# Patient Record
Sex: Female | Born: 1980 | Race: White | Hispanic: No | Marital: Married | Smoking: Never smoker
Health system: Southern US, Community
[De-identification: ages and names within clinical notes are randomized; demographics above are authoritative.]

## PROBLEM LIST (undated history)

## (undated) HISTORY — PX: APPENDECTOMY: SHX54

## (undated) HISTORY — PX: COLON SURGERY: SHX602

## (undated) HISTORY — PX: LIVER RESECTION: SHX1977

---

## 2013-06-11 ENCOUNTER — Emergency Department (HOSPITAL_BASED_OUTPATIENT_CLINIC_OR_DEPARTMENT_OTHER)
Admission: EM | Admit: 2013-06-11 | Discharge: 2013-06-11 | Disposition: A | Discharge status: Home / Self Care | Payer: Self-pay | Attending: Emergency Medicine | Admitting: Emergency Medicine

## 2013-06-11 ENCOUNTER — Encounter (HOSPITAL_BASED_OUTPATIENT_CLINIC_OR_DEPARTMENT_OTHER): Payer: Self-pay | Admitting: *Deleted

## 2013-06-11 ENCOUNTER — Emergency Department (HOSPITAL_BASED_OUTPATIENT_CLINIC_OR_DEPARTMENT_OTHER): Payer: Self-pay

## 2013-06-11 DIAGNOSIS — R109 Unspecified abdominal pain: Secondary | ICD-10-CM | POA: Insufficient documentation

## 2013-06-11 DIAGNOSIS — Z3202 Encounter for pregnancy test, result negative: Secondary | ICD-10-CM | POA: Insufficient documentation

## 2013-06-11 DIAGNOSIS — R112 Nausea with vomiting, unspecified: Secondary | ICD-10-CM | POA: Insufficient documentation

## 2013-06-11 LAB — CBC WITH DIFFERENTIAL/PLATELET
Basophils Absolute: 0 10*3/uL (ref 0.0–0.1)
HCT: 48.1 % — ABNORMAL HIGH (ref 36.0–46.0)
Lymphocytes Relative: 39 % (ref 12–46)
Neutro Abs: 3.4 10*3/uL (ref 1.7–7.7)
Platelets: 261 10*3/uL (ref 150–400)
RDW: 13.3 % (ref 11.5–15.5)
WBC: 6.8 10*3/uL (ref 4.0–10.5)

## 2013-06-11 LAB — COMPREHENSIVE METABOLIC PANEL WITH GFR
ALT: 16 U/L (ref 0–35)
AST: 19 U/L (ref 0–37)
Albumin: 3.9 g/dL (ref 3.5–5.2)
Alkaline Phosphatase: 88 U/L (ref 39–117)
BUN: 5 mg/dL — ABNORMAL LOW (ref 6–23)
CO2: 29 meq/L (ref 19–32)
Calcium: 9.9 mg/dL (ref 8.4–10.5)
Chloride: 102 meq/L (ref 96–112)
Creatinine, Ser: 0.7 mg/dL (ref 0.50–1.10)
GFR calc Af Amer: >90 mL/min
GFR calc non Af Amer: >90 mL/min
Glucose, Bld: 95 mg/dL (ref 70–99)
Potassium: 4.2 meq/L (ref 3.5–5.1)
Sodium: 141 meq/L (ref 135–145)
Total Bilirubin: <0.1 mg/dL — ABNORMAL LOW (ref 0.3–1.2)
Total Protein: 7.3 g/dL (ref 6.0–8.3)

## 2013-06-11 LAB — URINALYSIS, ROUTINE W REFLEX MICROSCOPIC
Bilirubin Urine: NEGATIVE
Hgb urine dipstick: NEGATIVE
Ketones, ur: NEGATIVE mg/dL
Specific Gravity, Urine: 1.007 (ref 1.005–1.030)
Urobilinogen, UA: 0.2 mg/dL (ref 0.0–1.0)

## 2013-06-11 LAB — LIPASE, BLOOD: Lipase: 26 U/L (ref 11–59)

## 2013-06-11 LAB — URINE MICROSCOPIC-ADD ON

## 2013-06-11 MED ORDER — MORPHINE SULFATE 4 MG/ML IJ SOLN
4.0000 mg | Freq: Once | INTRAMUSCULAR | Status: AC
  Administered 2013-06-11: 4 mg via INTRAVENOUS
  Filled 2013-06-11: qty 1

## 2013-06-11 MED ORDER — IOHEXOL 300 MG/ML  SOLN
100.0000 mL | Freq: Once | INTRAMUSCULAR | Status: AC | PRN
  Administered 2013-06-11: 100 mL via INTRAVENOUS

## 2013-06-11 MED ORDER — MORPHINE SULFATE 4 MG/ML IJ SOLN
INTRAMUSCULAR | Status: AC
  Administered 2013-06-11: 4 mg via INTRAVENOUS
  Filled 2013-06-11: qty 1

## 2013-06-11 MED ORDER — PROMETHAZINE HCL 25 MG PO TABS
25.0000 mg | ORAL_TABLET | Freq: Once | ORAL | Status: AC
  Administered 2013-06-11: 25 mg via ORAL
  Filled 2013-06-11: qty 1

## 2013-06-11 MED ORDER — IOHEXOL 300 MG/ML  SOLN
50.0000 mL | Freq: Once | INTRAMUSCULAR | Status: AC | PRN
  Administered 2013-06-11: 50 mL via ORAL

## 2013-06-11 MED ORDER — OXYCODONE-ACETAMINOPHEN 5-325 MG PO TABS: 1.0000 | ORAL_TABLET | ORAL | Status: DC | PRN

## 2013-06-11 MED ORDER — ONDANSETRON 8 MG PO TBDP: 8.0000 mg | ORAL_TABLET | Freq: Three times a day (TID) | ORAL | Status: DC | PRN

## 2013-06-11 MED ORDER — ONDANSETRON HCL 4 MG/2ML IJ SOLN
4.0000 mg | Freq: Once | INTRAMUSCULAR | Status: AC
  Administered 2013-06-11: 4 mg via INTRAVENOUS
  Filled 2013-06-11: qty 2

## 2013-06-11 MED ORDER — MORPHINE SULFATE 2 MG/ML IJ SOLN
INTRAMUSCULAR | Status: AC
  Administered 2013-06-11: 6 mg via INTRAVENOUS
  Filled 2013-06-11: qty 1

## 2013-06-11 MED ORDER — MORPHINE SULFATE 4 MG/ML IJ SOLN: 6.0000 mg | Freq: Once | INTRAMUSCULAR | Status: AC

## 2013-06-11 MED ORDER — HYDROMORPHONE HCL PF 1 MG/ML IJ SOLN
1.0000 mg | Freq: Once | INTRAMUSCULAR | Status: AC
  Administered 2013-06-11: 1 mg via INTRAVENOUS
  Filled 2013-06-11: qty 1

## 2013-06-11 NOTE — ED Notes (Signed)
Pt c/o diffuse abd pain , n/v/d x 2 days

## 2013-06-11 NOTE — ED Notes (Signed)
No adverse reactions to medication.

## 2013-06-11 NOTE — ED Notes (Signed)
Pt assisted to bathroom in NAD.  Upon returning to bed, Pt called out c/o increased abdominal pain which was worse after getting pain medications.  Discussed with Dr. Micheline Maze.  Will see.

## 2013-06-11 NOTE — ED Notes (Signed)
Pt reports having had morphine about 5 years ago without issues..pt also sts she had hives once with it.

## 2013-06-11 NOTE — ED Provider Notes (Signed)
CSN: [id number]     Arrival date & time 06/11/13  1337 History     First MD Initiated Contact with Patient 06/11/13 1346     Chief Complaint  Patient presents with  . Abdominal Pain    HPI Presents with 2-3 days of left-sided abdominal pain with associated nausea and vomiting.  No diarrhea.  No fevers or chills.  No flank pain.  No urinary complaints.  No vaginal complaints.  Her pain is mild to moderate in severity.  Nothing worsens or improves her pain.  The patient is a prior appendectomy as well as cholecystectomy.  She denies lower abdominal discomfort.  No chest pain or shortness of breath.  No cough or congestion.  History of PE.   History reviewed. No pertinent past medical history. Past Surgical History  Procedure Laterality Date  . Appendectomy    . Colon surgery    . Cesarean section    . Liver resection     History reviewed. No pertinent family history. History  Substance Use Topics  . Smoking status: Never Smoker   . Smokeless tobacco: Not on file  . Alcohol Use: No   OB History   Grav Para Term Preterm Abortions TAB SAB Ect Mult Living                 Review of Systems  All other systems reviewed and are negative.    Allergies  Morphine and related and Sulfa antibiotics  Home Medications   Current Outpatient Rx  Name  Route  Sig  Dispense  Refill  . ondansetron (ZOFRAN ODT) 8 MG disintegrating tablet   Oral   Take 1 tablet (8 mg total) by mouth every 8 (eight) hours as needed for nausea.   10 tablet   0   . oxyCODONE-acetaminophen (PERCOCET/ROXICET) 5-325 MG per tablet   Oral   Take 1 tablet by mouth every 4 (four) hours as needed for pain.   20 tablet   0    BP 117/79  Pulse 107  Temp(Src) 97.9 F (36.6 C) (Oral)  Resp 20  Ht 5\' 6"  (1.676 m)  Wt 185 lb (83.915 kg)  BMI 29.87 kg/m2  SpO2 100%  LMP 05/25/2013 Physical Exam  Nursing note and vitals reviewed. Constitutional: She is oriented to person, place, and time. She appears  well-developed and well-nourished. No distress.  HENT:  Head: Normocephalic and atraumatic.  Eyes: EOM are normal.  Neck: Normal range of motion.  Cardiovascular: Normal rate, regular rhythm and normal heart sounds.   Pulmonary/Chest: Effort normal and breath sounds normal.  Abdominal: Soft. She exhibits no distension.  Mild left-sided abdominal tenderness.  No guarding or rebound.  No rash.  No left flank tenderness.  Musculoskeletal: Normal range of motion.  Neurological: She is alert and oriented to person, place, and time.  Skin: Skin is warm and dry.  Psychiatric: She has a normal mood and affect. Judgment normal.    ED Course   Procedures (including critical care time)  Labs Reviewed  URINALYSIS, ROUTINE W REFLEX MICROSCOPIC - Abnormal; Notable for the following:    Leukocytes, UA SMALL (*)    All other components within normal limits  CBC WITH DIFFERENTIAL - Abnormal; Notable for the following:    RBC 5.20 (*)    Hemoglobin 15.9 (*)    HCT 48.1 (*)    All other components within normal limits  COMPREHENSIVE METABOLIC PANEL - Abnormal; Notable for the following:    BUN 5 (*)  Total Bilirubin <0.1 (*)    All other components within normal limits  PREGNANCY, URINE  URINE MICROSCOPIC-ADD ON  LIPASE, BLOOD   Ct Abdomen Pelvis W Contrast  06/11/2013   *RADIOLOGY REPORT*  Clinical Data: Diffuse abdominal pain.  Nausea vomiting for 2 days. Prior appendectomy, colon surgery and liver resection.  CT ABDOMEN AND PELVIS WITH CONTRAST  Technique:  Multidetector CT imaging of the abdomen and pelvis was performed following the standard protocol during bolus administration of intravenous contrast.  Contrast: 50mL OMNIPAQUE IOHEXOL 300 MG/ML  SOLN, OMNIPAQUE IOHEXOL 300 MG/ML  SOLN  Comparison: None.  Findings:  Under distended mid to distal transverse colon, descending colon and sigmoid colon limits evaluation for detection of colitis.  The small amount of free fluid in the pelvis  may be related to recent ovulation rather than colitis.  No definitive pericolonic inflammation identified.  Dilated proximal jejunum without point of obstruction identified. This may be related to the ingestion of contrast, enteritis although adhesion and partial obstruction not excluded.  No free intraperitoneal air.  Post cholecystectomy.  Within the liver anterior to the gallbladder fossa is a 3.8 x 3.6 x 3.5 cm liver lesion of indeterminate etiology.  Dedicated MRI of the liver recommended for further evaluation.  No focal worrisome splenic, pancreatic, renal or adrenal lesion.  No worrisome bony destructive lesion.  No abdominal aortic aneurysm.  No adenopathy.  Retroverted uterus.  No worrisome adnexal mass identified by CT.  Noncontrast filled imaging of the urinary bladder unremarkable.  Lung bases clear.  IMPRESSION: Under distended mid to distal transverse colon, descending colon and sigmoid colon limits evaluation for detection of colitis. No definitive pericolonic inflammation identified.  Dilated proximal jejunum without point of obstruction identified. This may be related to the ingestion of contrast or enteritis although adhesion and partial obstruction not excluded.  Within the liver anterior to the gallbladder fossa is a 3.8 x 3.6 x 3.5 cm liver lesion of indeterminate etiology.  Dedicated MRI of the liver recommended for further evaluation.   Original Report Authenticated By: Lacy Duverney, M.D.   1. Abdominal pain     MDM  CT scan pending.  Symptomatic treatment.  4:25 PM Patient is feeling better at this time.  I suspect this is more a gastroenteritis type presentation as she has had some loose stools he now reports.  She may have an early partial small bowel obstruction given her history abdominal surgery however my suspicion is low her.  She will be sent home with nausea medicine and pain medicine and instructed her on a clear liquid diet for the first 24 hours and then advance her  diet as tolerated.  She understands to return to the ER for new or worsening symptoms.  She does have benefits to the [facility] however she has not set up her care with the [facility] here in [place].  She does have a prior history of liver cancer for which she received directed chemotherapy in [place] at the [facility] better.  She has been informed of the abnormal finding in her liver and this is likely from her prior chemotherapy and radiation however she understands importance of close followup.  She states she will develop a relationship with the [facility] here in [place] and follow up with them regarding the potential need for an abdominal MRI as they will then have access to her prior oncology records.  She will return to the ER for new or worsening  symptoms.  Lyanne Co, MD 06/11/13 [phone]

## 2013-06-11 NOTE — ED Provider Notes (Signed)
5:28 PM Pt complaining of worsening pain after IV morphine.  Have rexamined.  +ttp epigastrium, LLQ w/o rebound or guarding.  Dose of IV dilaudid given and pt felt improved.  She was d/c'd home by Dr. Patria Mane' instructions.   1. Abdominal pain      Shanna Cisco, MD 06/12/13 0021

## 2013-06-22 ENCOUNTER — Ambulatory Visit: Payer: Self-pay | Attending: Internal Medicine | Admitting: Internal Medicine

## 2013-06-22 VITALS — BP 120/70 | HR 75 | Temp 98.4°F | Resp 16 | Ht 66.0 in | Wt 181.4 lb

## 2013-06-22 DIAGNOSIS — R160 Hepatomegaly, not elsewhere classified: Secondary | ICD-10-CM

## 2013-06-22 DIAGNOSIS — K769 Liver disease, unspecified: Secondary | ICD-10-CM

## 2013-06-22 MED ORDER — ZOLPIDEM TARTRATE 5 MG PO TABS: 5.0000 mg | ORAL_TABLET | Freq: Every evening | ORAL | Status: DC | PRN

## 2013-06-22 MED ORDER — TRAMADOL HCL 50 MG PO TABS: 50.0000 mg | ORAL_TABLET | Freq: Three times a day (TID) | ORAL | Status: DC | PRN

## 2013-06-22 NOTE — Progress Notes (Signed)
Pt. Is here to establish care. Pt states that she has a tumor on her liver, she also says that she has stomach pain. She has had some blood in her stool and inconstant stool. She recently was in the hospital for sever abdominal pain. D. Winfree C.M.A.

## 2013-06-22 NOTE — Patient Instructions (Signed)
Magnetic Resonance Imaging Magnetic resonance imaging (MRI) is a procedure that lets your doctor see detailed pictures of the inside of your body. MRI does not use X-rays. Instead, strong magnets and radio waves work together to form a sharp image. There is no X-ray radiation and the magnets and radio waves are harmless. Follow any instructions you were given to prepare for the test. LET YOUR CAREGIVER KNOW ABOUT: (Note: The magnet used in MRI can cause metal objects in your body to move.)  Any previous surgery.  If you have a pacemaker or any other implants.  You have metal splinters in your body.  You have tattoos.  You are pregnant or think you may be pregnant.  You are claustrophobic (afraid of confined spaces). BEFORE THE PROCEDURE  MRI uses strong magnets, so you will be asked to remove your watch, jewelry, and other metal objects. Some makeup also contains traces of metal, so you may have to remove that too. Braces and fillings normally are not a problem. An MRI test takes up to an hour. Please check in 60 minutes before your test is scheduled or as directed. PROCEDURE  You may be asked to wear a hospital gown.  The machine can be noisy. You may be given earplugs or headphones for music.  You may be injected with CONTRAST (a special dye that enhances the image).  MRI is done in a long magnetic chamber. You will lie down on a platform that slides into the magnetic chamber. Once inside, you will still be able to talk to the technologist.  You will be asked to hold very still. The technologist will tell you when you can shift position. You may have to wait a few minutes to make sure the images are readable. AFTER THE PROCEDURE  You can get back to normal activities right away. If you were given contrast, it will pass naturally through your body within a day. Your caregiver will let you know when the results are in or you may call back to check your results. Document Released:  10/08/2000 Document Revised: 01/03/2012 Document Reviewed: 10/11/2005 Exit[employer] Patient Information 2014 [employer].  

## 2013-06-22 NOTE — Progress Notes (Signed)
Patient ID: Erica Terry, female   DOB: 20-Dec-1980, 32 y.o.   MRN: [id number]  CC: for evaluation of liver mass  HPI: 32 year old female with no significant past medical history who presented to the clinic for evaluation of liver mass found on CT scan. Patient has no current complaints but he wanted to see what is the next step in management for evaluation. She was seen in the ER but did not want to be admitted due to financial reasons. Patient has no complaints of abdominal pain, nausea or vomiting. No complaints of weight loss.  Allergies  Allergen Reactions  . Morphine And Related   . Sulfa Antibiotics    No past medical history on file. Current Outpatient Prescriptions on File Prior to Visit  Medication Sig Dispense Refill  . ondansetron (ZOFRAN ODT) 8 MG disintegrating tablet Take 1 tablet (8 mg total) by mouth every 8 (eight) hours as needed for nausea.  10 tablet  0  . oxyCODONE-acetaminophen (PERCOCET/ROXICET) 5-325 MG per tablet Take 1 tablet by mouth every 4 (four) hours as needed for pain.  20 tablet  0   No current facility-administered medications on file prior to visit.   Family medical history significant for HTN, HLD,  History   Social History  . Marital Status: Married    Spouse Name: N/A    Number of Children: N/A  . Years of Education: N/A   Occupational History  . Not on file.   Social History Main Topics  . Smoking status: Never Smoker   . Smokeless tobacco: Not on file  . Alcohol Use: No  . Drug Use: No  . Sexual Activity: Yes    Birth Control/ Protection: None   Other Topics Concern  . Not on file   Social History Narrative  . No narrative on file    Review of Systems  Constitutional: Negative for fever, chills, diaphoresis, activity change, appetite change and fatigue.  HENT: Negative for ear pain, nosebleeds, congestion, facial swelling, rhinorrhea, neck pain, neck stiffness and ear discharge.   Eyes: Negative for pain, discharge, redness,  itching and visual disturbance.  Respiratory: Negative for cough, choking, chest tightness, shortness of breath, wheezing and stridor.   Cardiovascular: Negative for chest pain, palpitations and leg swelling.  Gastrointestinal: Negative for abdominal distention.  Genitourinary: Negative for dysuria, urgency, frequency, hematuria, flank pain, decreased urine volume, difficulty urinating and dyspareunia.  Musculoskeletal: Negative for back pain, joint swelling, arthralgias and gait problem.  Neurological: Negative for dizziness, tremors, seizures, syncope, facial asymmetry, speech difficulty, weakness, light-headedness, numbness and headaches.  Hematological: Negative for adenopathy. Does not bruise/bleed easily.  Psychiatric/Behavioral: Negative for hallucinations, behavioral problems, confusion, dysphoric mood, decreased concentration and agitation.    Objective:   Filed Vitals:   06/22/13 1432  BP: 120/70  Pulse: 75  Temp:   Resp:     Physical Exam  Constitutional: Appears well-developed and well-nourished. No distress.  HENT: Normocephalic. External right and left ear normal. Oropharynx is clear and moist.  Eyes: Conjunctivae and EOM are normal. PERRLA, no scleral icterus.  Neck: Normal ROM. Neck supple. No JVD. No tracheal deviation. No thyromegaly.  CVS: RRR, S1/S2 +, no murmurs, no gallops, no carotid bruit.  Pulmonary: Effort and breath sounds normal, no stridor, rhonchi, wheezes, rales.  Abdominal: Soft. BS +,  no distension, tenderness, rebound or guarding.  Musculoskeletal: Normal range of motion. No edema and no tenderness.  Lymphadenopathy: No lymphadenopathy noted, cervical, inguinal. Neuro: Alert. Normal reflexes, muscle tone coordination. No cranial  nerve deficit. Skin: Skin is warm and dry. No rash noted. Not diaphoretic. No erythema. No pallor.  Psychiatric: Normal mood and affect. Behavior, judgment, thought content normal.   Lab Results  Component Value Date    WBC 6.8 06/11/2013   HGB 15.9* 06/11/2013   HCT 48.1* 06/11/2013   MCV 92.5 06/11/2013   PLT 261 06/11/2013   Lab Results  Component Value Date   CREATININE 0.70 06/11/2013   BUN 5* 06/11/2013   NA 141 06/11/2013   K 4.2 06/11/2013   CL 102 06/11/2013   CO2 29 06/11/2013    No results found for this basename: HGBA1C   Lipid Panel  No results found for this basename: chol, trig, hdl, cholhdl, vldl, ldlcalc       Assessment and plan:   Patient Active Problem List   Diagnosis Date Noted  . Liver mass 06/22/2013    Priority: High - referral provided to GI for further evaluation - ultram PRN for pain relief as needed

## 2013-06-26 ENCOUNTER — Other Ambulatory Visit: Payer: Self-pay | Admitting: Internal Medicine

## 2013-06-26 DIAGNOSIS — R160 Hepatomegaly, not elsewhere classified: Secondary | ICD-10-CM

## 2013-06-28 ENCOUNTER — Ambulatory Visit: Payer: Self-pay

## 2013-06-29 ENCOUNTER — Ambulatory Visit: Payer: Self-pay | Attending: Internal Medicine

## 2013-06-29 ENCOUNTER — Ambulatory Visit (HOSPITAL_COMMUNITY)
Admission: RE | Admit: 2013-06-29 | Discharge: 2013-06-29 | Disposition: A | Discharge status: Home / Self Care | Payer: Self-pay | Source: Ambulatory Visit | Attending: Internal Medicine | Admitting: Internal Medicine

## 2013-06-29 DIAGNOSIS — R160 Hepatomegaly, not elsewhere classified: Secondary | ICD-10-CM

## 2013-06-29 DIAGNOSIS — K7689 Other specified diseases of liver: Secondary | ICD-10-CM | POA: Insufficient documentation

## 2013-06-29 DIAGNOSIS — Z9089 Acquired absence of other organs: Secondary | ICD-10-CM | POA: Insufficient documentation

## 2013-06-29 MED ORDER — GADOBENATE DIMEGLUMINE 529 MG/ML IV SOLN
20.0000 mL | Freq: Once | INTRAVENOUS | Status: AC
  Administered 2013-06-29: 17 mL via INTRAVENOUS

## 2013-07-03 ENCOUNTER — Encounter: Payer: Self-pay | Admitting: Internal Medicine

## 2013-07-03 ENCOUNTER — Ambulatory Visit: Payer: Self-pay | Attending: Internal Medicine | Admitting: Internal Medicine

## 2013-07-03 VITALS — BP 123/84 | HR 94 | Temp 98.1°F | Ht 66.0 in | Wt 180.0 lb

## 2013-07-03 DIAGNOSIS — R112 Nausea with vomiting, unspecified: Secondary | ICD-10-CM | POA: Insufficient documentation

## 2013-07-03 DIAGNOSIS — R197 Diarrhea, unspecified: Secondary | ICD-10-CM | POA: Insufficient documentation

## 2013-07-03 DIAGNOSIS — R109 Unspecified abdominal pain: Secondary | ICD-10-CM | POA: Insufficient documentation

## 2013-07-03 DIAGNOSIS — K769 Liver disease, unspecified: Secondary | ICD-10-CM

## 2013-07-03 DIAGNOSIS — R160 Hepatomegaly, not elsewhere classified: Secondary | ICD-10-CM

## 2013-07-03 MED ORDER — ONDANSETRON 4 MG PO TBDP: 4.0000 mg | ORAL_TABLET | Freq: Three times a day (TID) | ORAL | Status: AC | PRN

## 2013-07-03 NOTE — Progress Notes (Signed)
Patient ID: Erica Terry, female   DOB: 05/25/1981, 32 y.o.   MRN: [id number] Patient Demographics  Erica Terry, is a 32 y.o. female  WUJ:[id number]  [id number]  DOB - [birth year]  Chief Complaint  Patient presents with  . Shirlee Latch follow up        Subjective:   Erica Terry today is here for a follow up visit.  Patient is a 32 year old female presented for followup visit. Patient complains of having chronic nausea, vomiting, diarrhea, abdominal pain for the last 3 weeks., Has been able to tolerate liquid diet. Patient was seen in the ED in 8/14 for the same symptoms.  Objective:    Filed Vitals:   07/03/13 1704  BP: 123/84  Pulse: 94  Temp: 98.1 F (36.7 C)  TempSrc: Oral  Height: 5\' 6"  (1.676 m)  Weight: 180 lb (81.647 kg)  SpO2: 99%     ALLERGIES:   Allergies  Allergen Reactions  . Morphine And Related   . Sulfa Antibiotics     PAST MEDICAL HISTORY: No past medical history on file.  MEDICATIONS AT HOME: Prior to Admission medications   Not on File     Exam  General appearance :Awake, alert, NAD, Speech Clear.  HEENT: Atraumatic and Normocephalic, PERLA Neck: supple, no JVD. No cervical lymphadenopathy.  Chest: Clear to auscultation bilaterally, no wheezing, rales or rhonchi CVS: S1 S2 regular, no murmurs.  Abdomen: soft, NBS, tender in left lower quadrant and epigastric region  ND, no gaurding, rigidity or rebound. Extremities: no cyanosis or clubbing, B/L Lower Ext shows no edema Neurology: Awake alert, and oriented X 3, CN II-XII intact, Non focal Skin: No Rash or lesions Wounds:N/A    Data Review   Basic Metabolic Panel: No results found for this basename: NA, K, CL, CO2, GLUCOSE, BUN, CREATININE, CALCIUM, MG, PHOS,  in the last 168 hours Liver Function Tests: No results found for this basename: AST, ALT, ALKPHOS, BILITOT, PROT, ALBUMIN,  in the last 168 hours  CBC: No results found for this basename: WBC, NEUTROABS, HGB,  HCT, MCV, PLT,  in the last 168 hours  ------------------------------------------------------------------------------------------------------------------ No results found for this basename: HGBA1C,  in the last 72 hours ------------------------------------------------------------------------------------------------------------------ No results found for this basename: CHOL, HDL, LDLCALC, TRIG, CHOLHDL, LDLDIRECT,  in the last 72 hours ------------------------------------------------------------------------------------------------------------------ No results found for this basename: TSH, T4TOTAL, FREET3, T3FREE, THYROIDAB,  in the last 72 hours ------------------------------------------------------------------------------------------------------------------ No results found for this basename: VITAMINB12, FOLATE, FERRITIN, TIBC, IRON, RETICCTPCT,  in the last 72 hours  Coagulation profile  No results found for this basename: INR, PROTIME,  in the last 168 hours    Assessment & Plan   Active Problems: Nausea, vomiting, abdominal pain, diarrhea x 3 weeks: ? Colitis, denies any antibiotic use in last 3 months - I explained to the patient that she can go to the ER for urgent evaluation with a CT imaging and GI evaluation. Patient lives in [place] and is 45 minutes away from here. She states that she is able to tolerate liquid diet and can do outpatient CT imaging and urgent GI evaluation. - Urgent CT with contrast, ambulatory referral for urgent GI evaluation - Recommended patient to continue Zofran, liquid diet, tramadol   Follow-up in 1 week     Azalynn Maxim M.D. 07/03/2013, 5:50 PM

## 2013-07-10 ENCOUNTER — Telehealth: Payer: Self-pay

## 2013-07-10 NOTE — Telephone Encounter (Signed)
Appointment for [place] scheduled for 9/19 10 30  am Patient is aware of her appt and that she needs to pick up the contrast Prior to the appt

## 2013-07-13 ENCOUNTER — Ambulatory Visit (HOSPITAL_COMMUNITY): Payer: Self-pay

## 2013-07-18 ENCOUNTER — Ambulatory Visit: Payer: Self-pay

## 2013-07-23 ENCOUNTER — Ambulatory Visit: Payer: Self-pay

## 2013-07-27 ENCOUNTER — Ambulatory Visit: Payer: Self-pay

## 2013-08-02 ENCOUNTER — Encounter: Payer: Self-pay | Admitting: Internal Medicine

## 2017-08-16 ENCOUNTER — Emergency Department (HOSPITAL_COMMUNITY)
Admission: EM | Admit: 2017-08-16 | Discharge: 2017-08-16 | Disposition: A | Discharge status: Home / Self Care | Payer: Self-pay | Attending: Emergency Medicine | Admitting: Emergency Medicine

## 2017-08-16 ENCOUNTER — Emergency Department (HOSPITAL_COMMUNITY): Payer: Self-pay

## 2017-08-16 ENCOUNTER — Encounter (HOSPITAL_COMMUNITY): Payer: Self-pay

## 2017-08-16 DIAGNOSIS — Y929 Unspecified place or not applicable: Secondary | ICD-10-CM | POA: Insufficient documentation

## 2017-08-16 DIAGNOSIS — Y939 Activity, unspecified: Secondary | ICD-10-CM | POA: Insufficient documentation

## 2017-08-16 DIAGNOSIS — Z79899 Other long term (current) drug therapy: Secondary | ICD-10-CM | POA: Insufficient documentation

## 2017-08-16 DIAGNOSIS — S0081XA Abrasion of other part of head, initial encounter: Secondary | ICD-10-CM | POA: Insufficient documentation

## 2017-08-16 DIAGNOSIS — F0781 Postconcussional syndrome: Secondary | ICD-10-CM | POA: Insufficient documentation

## 2017-08-16 DIAGNOSIS — Y999 Unspecified external cause status: Secondary | ICD-10-CM | POA: Insufficient documentation

## 2017-08-16 DIAGNOSIS — R1011 Right upper quadrant pain: Secondary | ICD-10-CM | POA: Insufficient documentation

## 2017-08-16 DIAGNOSIS — R55 Syncope and collapse: Secondary | ICD-10-CM | POA: Insufficient documentation

## 2017-08-16 LAB — COMPREHENSIVE METABOLIC PANEL
ALBUMIN: 3.7 g/dL (ref 3.5–5.0)
ALT: 18 U/L (ref 14–54)
ANION GAP: 9 (ref 5–15)
AST: 16 U/L (ref 15–41)
Alkaline Phosphatase: 96 U/L (ref 38–126)
BUN: 11 mg/dL (ref 6–20)
CHLORIDE: 104 mmol/L (ref 101–111)
CO2: 26 mmol/L (ref 22–32)
Calcium: 9.5 mg/dL (ref 8.9–10.3)
Creatinine, Ser: 0.65 mg/dL (ref 0.44–1.00)
GFR calc Af Amer: >60 mL/min (ref >60)
GFR calc non Af Amer: >60 mL/min (ref >60)
GLUCOSE: 120 mg/dL — AB (ref 65–99)
POTASSIUM: 3.8 mmol/L (ref 3.5–5.1)
SODIUM: 139 mmol/L (ref 135–145)
Total Bilirubin: 0.4 mg/dL (ref 0.3–1.2)
Total Protein: 7.1 g/dL (ref 6.5–8.1)

## 2017-08-16 LAB — CBC
HCT: 44.4 % (ref 36.0–46.0)
Hemoglobin: 15 g/dL (ref 12.0–15.0)
MCH: 30.5 pg (ref 26.0–34.0)
MCHC: 33.8 g/dL (ref 30.0–36.0)
MCV: 90.2 fL (ref 78.0–100.0)
Platelets: 295 10*3/uL (ref 150–400)
RBC: 4.92 MIL/uL (ref 3.87–5.11)
RDW: 13.5 % (ref 11.5–15.5)
WBC: 14.1 10*3/uL — ABNORMAL HIGH (ref 4.0–10.5)

## 2017-08-16 LAB — URINALYSIS, ROUTINE W REFLEX MICROSCOPIC
Bilirubin Urine: NEGATIVE
Glucose, UA: NEGATIVE mg/dL
HGB URINE DIPSTICK: NEGATIVE
Ketones, ur: NEGATIVE mg/dL
Leukocytes, UA: NEGATIVE
NITRITE: NEGATIVE
PROTEIN: NEGATIVE mg/dL
Specific Gravity, Urine: 1.011 (ref 1.005–1.030)
pH: 6 (ref 5.0–8.0)

## 2017-08-16 LAB — POC URINE PREG, ED: PREG TEST UR: NEGATIVE

## 2017-08-16 LAB — I-STAT TROPONIN, ED: Troponin i, poc: 0 ng/mL (ref 0.00–0.08)

## 2017-08-16 LAB — LIPASE, BLOOD: Lipase: 24 U/L (ref 11–51)

## 2017-08-16 MED ORDER — IOPAMIDOL (ISOVUE-300) INJECTION 61%: 100.0000 mL | Freq: Once | INTRAVENOUS | Status: DC | PRN

## 2017-08-16 MED ORDER — ONDANSETRON HCL 4 MG/2ML IJ SOLN
4.0000 mg | Freq: Once | INTRAMUSCULAR | Status: AC
  Administered 2017-08-16: 4 mg via INTRAVENOUS
  Filled 2017-08-16: qty 2

## 2017-08-16 MED ORDER — SODIUM CHLORIDE 0.9 % IV BOLUS (SEPSIS)
1000.0000 mL | Freq: Once | INTRAVENOUS | Status: AC
  Administered 2017-08-16: 1000 mL via INTRAVENOUS

## 2017-08-16 MED ORDER — IOPAMIDOL (ISOVUE-300) INJECTION 61%
INTRAVENOUS | Status: AC
  Filled 2017-08-16: qty 100
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
## 2017-08-16 NOTE — Discharge Instructions (Addendum)
Please read and follow all provided instructions.  Your diagnoses today include:  1. Post concussive syndrome     Tests performed today include: Vital signs. See below for your results today.   Medications prescribed:  Take as prescribed   Home care instructions:  Follow any educational materials contained in this packet. Please avoid driving until cleared by Primary Care Doctor or Neurologist  Follow-up instructions: Please follow-up with your primary care provider for further evaluation of symptoms and treatment   Return instructions:  Please return to the Emergency Department if you do not get better, if you get worse, or new symptoms OR  - Fever (temperature greater than 101.26F)  - Bleeding that does not stop with holding pressure to the area    -Severe pain (please note that you may be more sore the day after your accident)  - Chest Pain  - Difficulty breathing  - Severe nausea or vomiting  - Inability to tolerate food and liquids  - Passing out  - Skin becoming red around your wounds  - Change in mental status (confusion or lethargy)  - New numbness or weakness    Please return if you have any other emergent concerns.  Additional Information:  Your vital signs today were: BP (!) 134/100 (BP Location: Right Arm)    Pulse 90    Temp 98.5 F (36.9 C) (Oral)    Resp 16    Ht 5\' 6"  (1.676 m)    Wt 101.6 kg (224 lb)    LMP 07/26/2017 (Approximate)    SpO2 98%    BMI 36.15 kg/m  If your blood pressure (BP) was elevated above 135/85 this visit, please have this repeated by your doctor within one month. ---------------  There was an area of questionable abnormality noted on the abdominal CT within the liver. It is recommended that you undergo an abdominal MRI as an outpatient.    Head Injury You have been seen today for a head injury. It does not appear to be serious at this time.  Close observation: The close observation period is usually 6 hours from the injury. This  includes staying awake and having a trustworthy adult monitor you to assure your condition does not worsen. You should be in regular contact with this person and ideally, they should be able to monitor you in person.  Secondary observation: The secondary observation period is usually 24 hours from the injury. You are allowed to sleep during this time. A trustworthy adult should intermittently monitor you to assure your condition does not worsen.   Overall head injury/concussion care: Rest: Be sure to get plenty of rest. You will need more rest and sleep while you recover. Hydration: Be sure to stay well hydrated by having a goal of drinking about 0.5 liters of water an hour. Pain:  Antiinflammatory medications: Take 600 mg of ibuprofen every 6 hours or 440 mg (over the counter dose) to 500 mg (prescription dose) of naproxen every 12 hours or for the next 3 days. After this time, these medications may be used as needed for pain. Take these medications with food to avoid upset stomach. Choose only one of these medications, do not take them together. Tylenol: Should you continue to have additional pain while taking the ibuprofen or naproxen, you may add in tylenol as needed. Your daily total maximum amount of tylenol from all sources should be limited to 4000mg /day for persons without liver problems, or 2000mg /day for those with liver problems. Return to  sports and activities: In general, you may return to normal activities once symptoms have subsided, however, you would ideally be cleared by a primary care provider or other qualified medical professional prior to return to these activities.  Follow up: Follow up with the concussion clinic or your primary care provider for further management of this issue. Return: Return to the ED should any symptoms worsen.
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
## 2017-08-16 NOTE — Progress Notes (Signed)
Consult request has been received. CSW attempting to follow up at present time.  [place] Straus F. Cherine Drumgoole, LCSW, LCAS, CSI Clinical Social Worker Ph: [phone]  
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
## 2017-08-16 NOTE — Progress Notes (Signed)
CSW attempted to meet with pt, but pt is not in her room.  CSW will continue to follow for D/C needs.  Dorothe PeaJonathan F. Donzella Carrol, LCSW, LCAS, CSI Clinical Social Worker Ph: [phone]

## 2018-02-12 IMAGING — CT CT HEAD W/O CM
3 of 4 series · 16 of 47 positions shown, 19 images · non-contrast
Comparison: None.

CLINICAL DATA: Spontaneous loss of consciousness today.

EXAM:
CT HEAD WITHOUT CONTRAST
TECHNIQUE: Contiguous axial images were obtained from the base of the skull
through the vertex without intravenous contrast.

[Series 2: head w/o · axial · non-contrast · 0.46mm/px · z∈[-141,-16]mm · 10 of 31 slices shown, 13 images]
[im 3/31  brain]
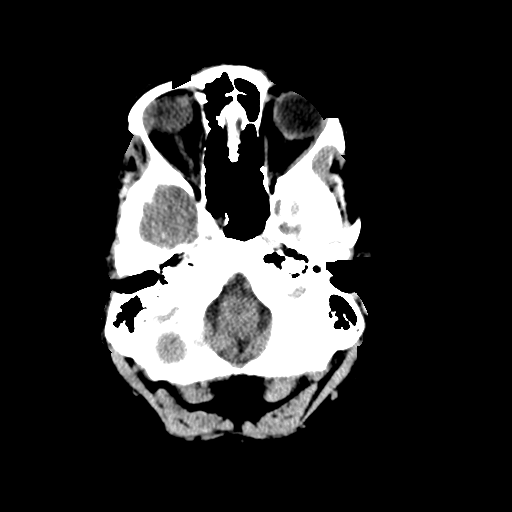
[im 3/31  bone]
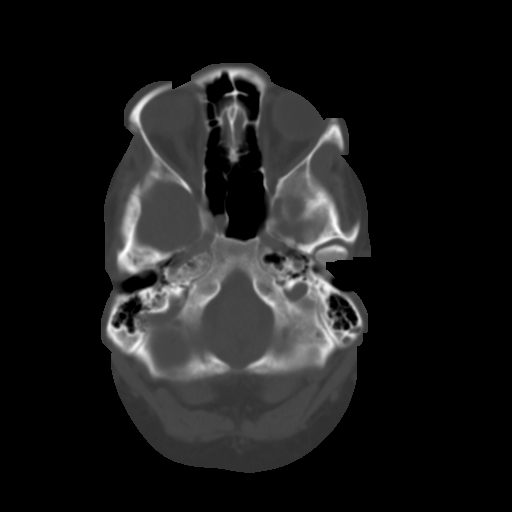
[im 5/31  brain]
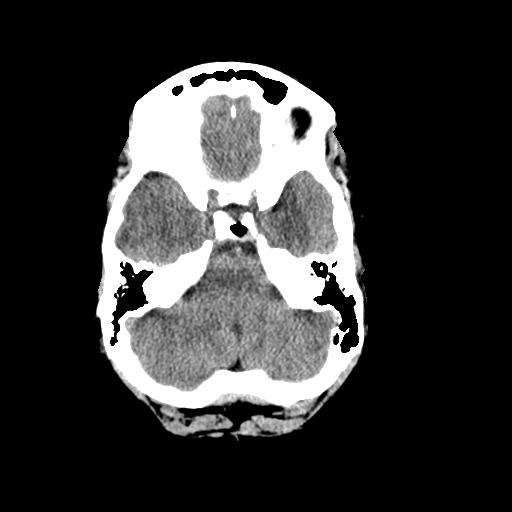
[im 9/31  brain]
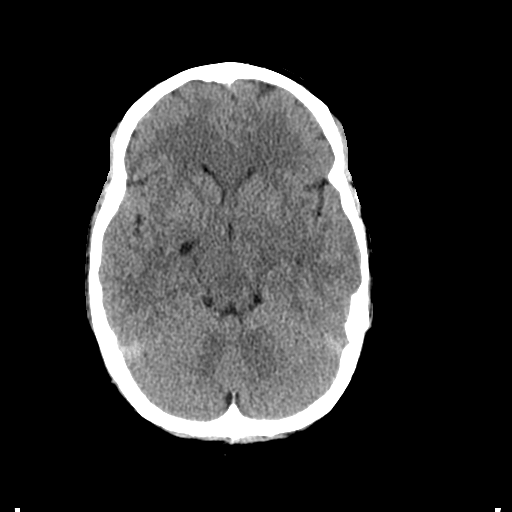
[im 11/31  brain]
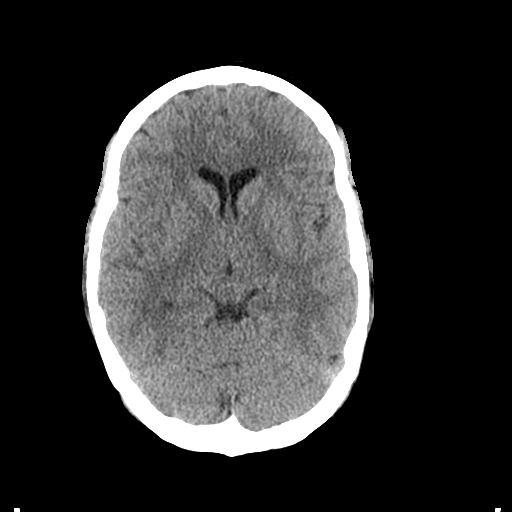
[im 13/31  brain]
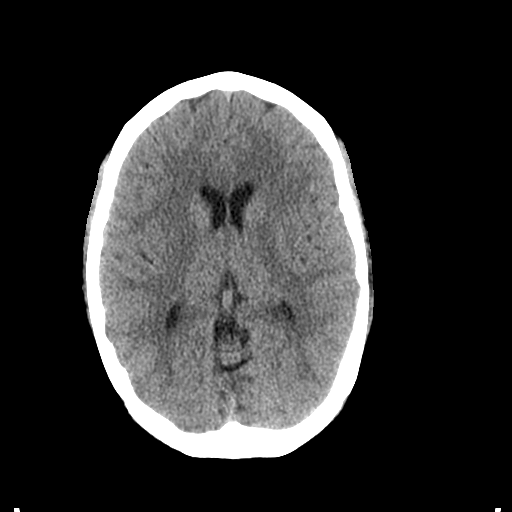
[im 13/31  bone]
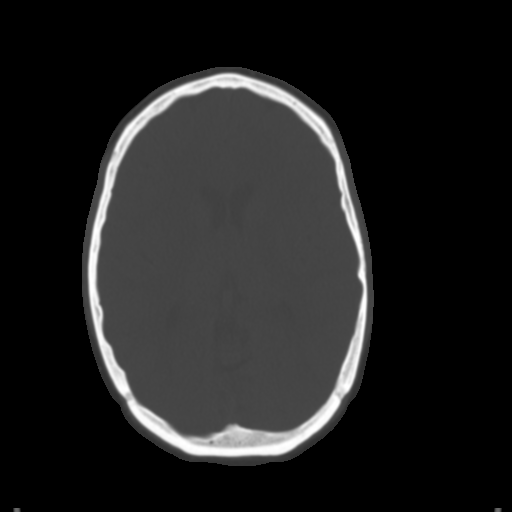
[im 18/31  brain]
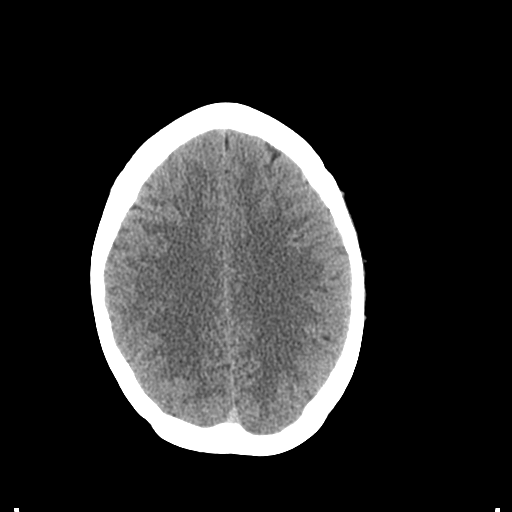
[im 20/31  brain]
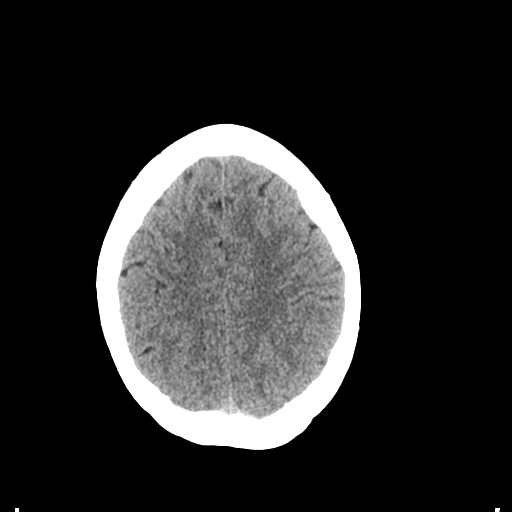
[im 22/31  brain]
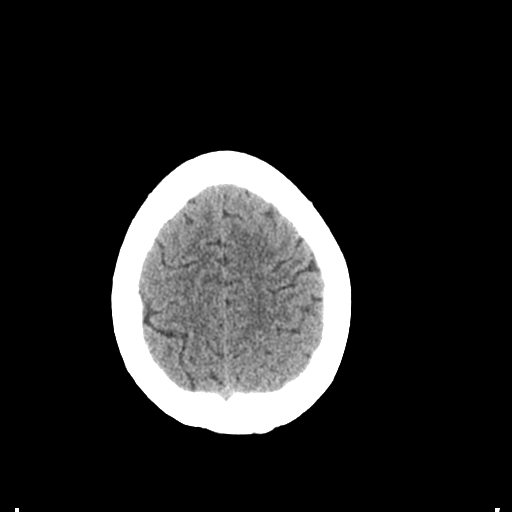
[im 26/31  brain]
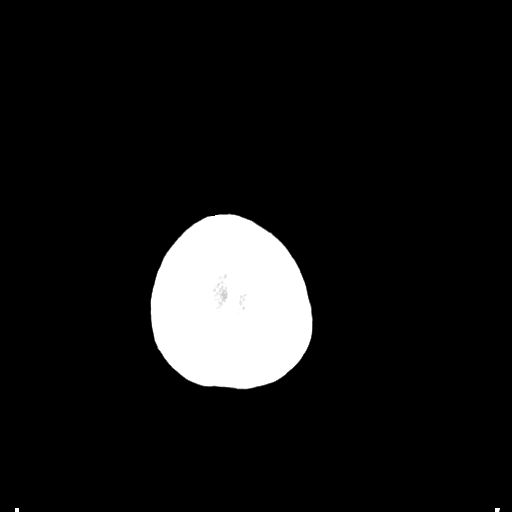
[im 26/31  bone]
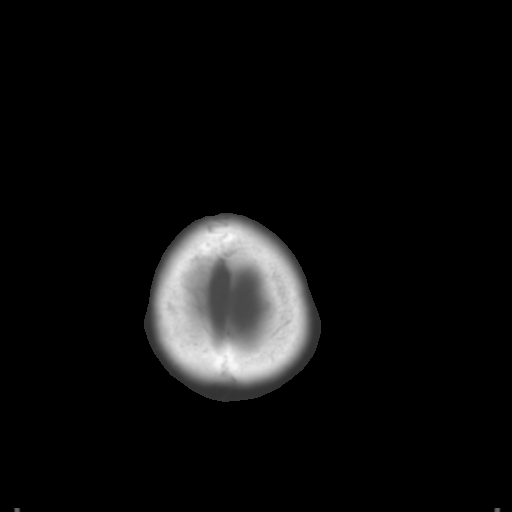
[im 28/31  brain]
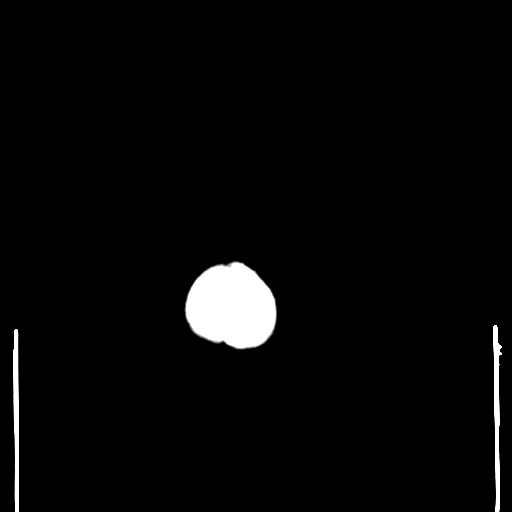

[Series 4: coronal · coronal · 0.28mm/px · 3 of 62 slices shown]
[im 21/62  brain]
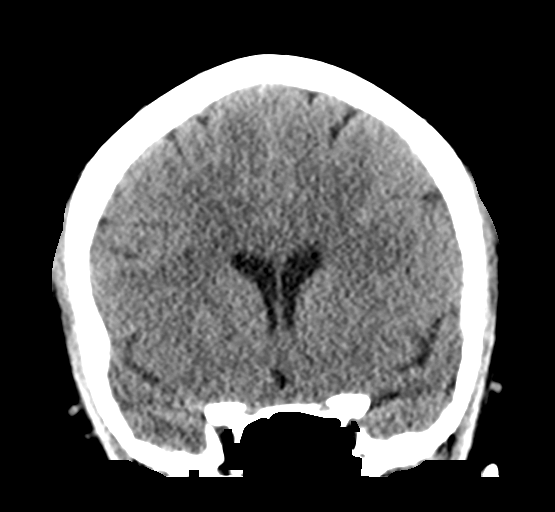
[im 28/62  brain]
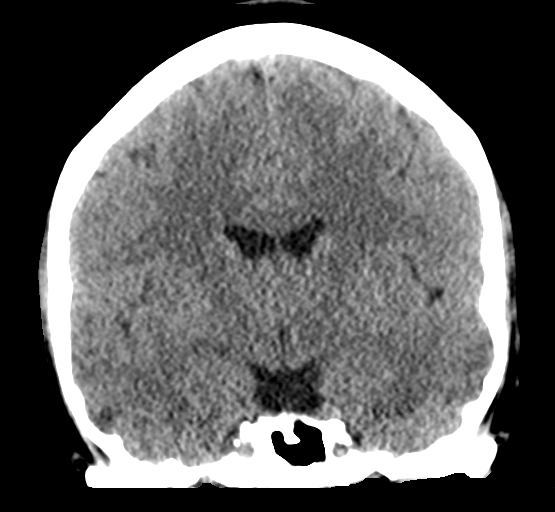
[im 34/62  brain]
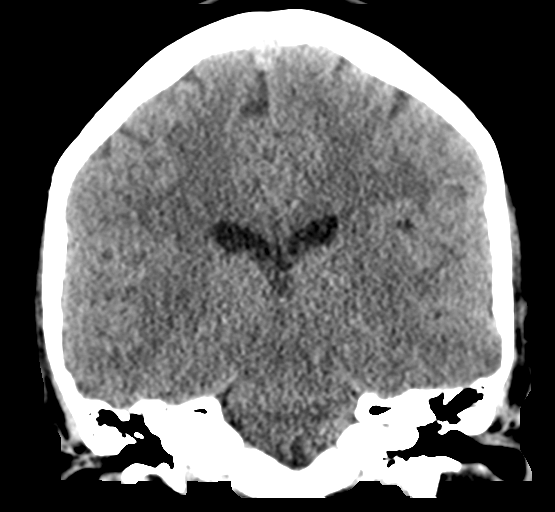

[Series 7: sagittal · sagittal · 0.32mm/px · 3 of 48 slices shown]
[im 16/48  brain]
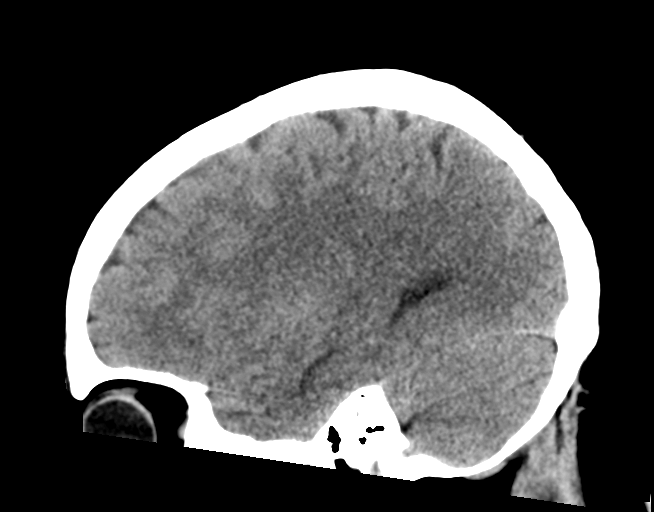
[im 24/48  brain]
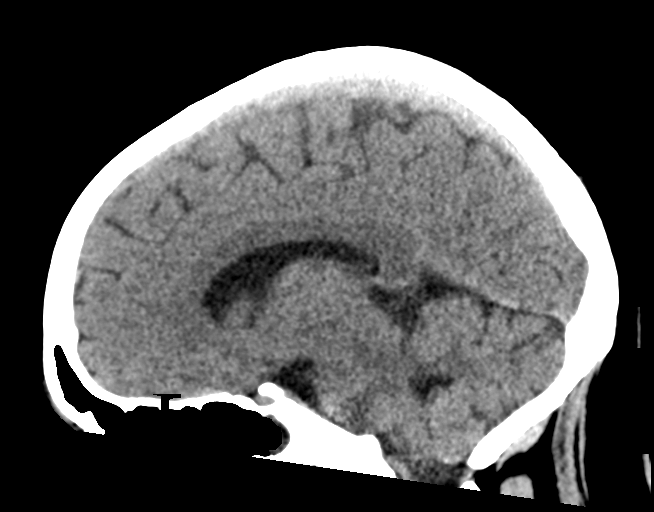
[im 32/48  brain]
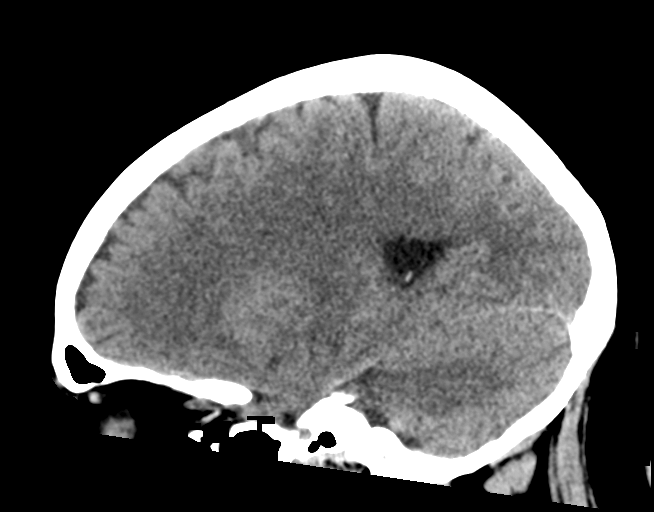

[16 of 47 positions shown; findings below may reference images not displayed]

FINDINGS: Brain: Appears normal without hemorrhage, infarct, mass lesion, mass
effect, midline shift or abnormal extra-axial fluid collection. No
hydrocephalus or pneumocephalus.

Vascular: Negative.

Skull: Intact.

Sinuses/Orbits: Normal.

Other: None.
IMPRESSION: Normal head CT.
